# Patient Record
Sex: Female | Born: 2000 | Race: Black or African American | Hispanic: No | Marital: Single | Smoking: Never smoker
Health system: Southern US, Community
[De-identification: ages and names within clinical notes are randomized; demographics above are authoritative.]

## PROBLEM LIST (undated history)

## (undated) DIAGNOSIS — J45909 Unspecified asthma, uncomplicated: Secondary | ICD-10-CM

---
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
## 2020-01-26 ENCOUNTER — Other Ambulatory Visit: Payer: Self-pay

## 2020-01-26 ENCOUNTER — Ambulatory Visit (LOCAL_COMMUNITY_HEALTH_CENTER): Payer: Medicaid Other

## 2020-01-26 VITALS — BP 123/77 | Ht 62.0 in | Wt 170.0 lb

## 2020-01-26 DIAGNOSIS — Z3042 Encounter for surveillance of injectable contraceptive: Secondary | ICD-10-CM

## 2020-01-26 DIAGNOSIS — Z3009 Encounter for other general counseling and advice on contraception: Secondary | ICD-10-CM

## 2020-01-26 DIAGNOSIS — Z30013 Encounter for initial prescription of injectable contraceptive: Secondary | ICD-10-CM | POA: Diagnosis not present

## 2020-01-26 MED ORDER — MEDROXYPROGESTERONE ACETATE 150 MG/ML IM SUSP
150.0000 mg | Freq: Once | INTRAMUSCULAR | Status: AC
  Administered 2020-01-26: 150 mg via INTRAMUSCULAR

## 2020-01-26 NOTE — Progress Notes (Signed)
14.1 weeks post depo today.  DMPA 150 mg IM administered today per Larene Pickett, FNP order dated 08/03/2019.

## 2020-02-16 ENCOUNTER — Ambulatory Visit: Payer: Self-pay | Admitting: Advanced Practice Midwife

## 2020-02-16 ENCOUNTER — Encounter: Payer: Self-pay | Admitting: Advanced Practice Midwife

## 2020-02-16 ENCOUNTER — Other Ambulatory Visit: Payer: Self-pay

## 2020-02-16 DIAGNOSIS — Z5321 Procedure and treatment not carried out due to patient leaving prior to being seen by health care provider: Secondary | ICD-10-CM

## 2020-02-16 NOTE — Progress Notes (Signed)
Here today for STD screening. Declines bloodwork. Kytzia Gienger, RN ? ?

## 2020-02-16 NOTE — Progress Notes (Addendum)
Clinic was running behind. Explained this to patient when brought back to exam room. Patient verbalized understanding but then later changed mind. Patient left without being seen. Stated she had to reschedule appt due to time restraint. Tawny Hopping, RN

## 2020-04-26 ENCOUNTER — Other Ambulatory Visit: Payer: Self-pay

## 2020-04-26 ENCOUNTER — Ambulatory Visit (LOCAL_COMMUNITY_HEALTH_CENTER): Payer: Medicaid Other

## 2020-04-26 VITALS — BP 121/70 | Ht 62.0 in | Wt 166.5 lb

## 2020-04-26 DIAGNOSIS — Z30013 Encounter for initial prescription of injectable contraceptive: Secondary | ICD-10-CM

## 2020-04-26 DIAGNOSIS — Z3009 Encounter for other general counseling and advice on contraception: Secondary | ICD-10-CM | POA: Diagnosis not present

## 2020-04-26 MED ORDER — MEDROXYPROGESTERONE ACETATE 150 MG/ML IM SUSP
150.0000 mg | Freq: Once | INTRAMUSCULAR | Status: AC
  Administered 2020-04-26: 150 mg via INTRAMUSCULAR

## 2020-04-26 NOTE — Progress Notes (Signed)
Folic acid counseling completed and MVI declined. Depo administered without difficulty per Dr. Alvester Morin standing order and client tolerated without complaint. Client counseled that physical is overdue and needed prior to Depo due date of 07/12/2020. Client states will schedule appt so can have physical / Depo same day. Ruth Ng, RN

## 2020-08-23 ENCOUNTER — Ambulatory Visit
Admission: EM | Admit: 2020-08-23 | Discharge: 2020-08-23 | Disposition: A | Discharge status: Home / Self Care | Payer: Medicaid Other | Attending: Family Medicine | Admitting: Family Medicine

## 2020-08-23 ENCOUNTER — Other Ambulatory Visit: Payer: Self-pay

## 2020-08-23 DIAGNOSIS — M546 Pain in thoracic spine: Secondary | ICD-10-CM

## 2020-08-23 DIAGNOSIS — T148XXA Other injury of unspecified body region, initial encounter: Secondary | ICD-10-CM

## 2020-08-23 MED ORDER — CYCLOBENZAPRINE HCL 10 MG PO TABS: 10.0000 mg | ORAL_TABLET | Freq: Two times a day (BID) | ORAL | 0 refills | Status: AC | PRN

## 2020-08-23 MED ORDER — IBUPROFEN 800 MG PO TABS: 800.0000 mg | ORAL_TABLET | Freq: Three times a day (TID) | ORAL | 0 refills | Status: AC | PRN

## 2020-08-23 NOTE — Discharge Instructions (Signed)
Take ibuprofen as needed for your pain.    Take the muscle relaxer Flexeril as needed for muscle spasm; Do not drive, operate machinery, or drink alcohol with this medication as it may make you drowsy.    Follow up with your primary care provider or an orthopedist if your pain is not improving.     

## 2020-08-23 NOTE — ED Triage Notes (Signed)
Pt is here with lower back pain from putting up a 50lb pallat today at work, pt has not taken any meds to relieve discomfort. States it hurts more when she takes deep breaths and twist and turns.

## 2020-08-23 NOTE — ED Provider Notes (Signed)
[facility]   [id number] 08/23/20 Arrival Time: 1451  FG:HWEXH PAIN  SUBJECTIVE: History from: patient. Ruth Mills is a 19 y.o. female complains of left-sided thoracic back pain that began at 12:00 today.  Reports that she was lifting a pallet at work, and that when she twisted she felt something pull and has had pain since.  Has not attempted OTC treatment at this time.  Symptoms are alleviated with rest, symptoms are aggravated with activity. Denies similar symptoms in the past. Denies fever, chills, erythema, ecchymosis, effusion, weakness, numbness and tingling, saddle paresthesias, loss of bowel or bladder function.      ROS: As per HPI.  All other pertinent ROS negative.     Past Medical History:  Diagnosis Date  . Asthma    History reviewed. No pertinent surgical history. No Known Allergies No current facility-administered medications on file prior to encounter.   Current Outpatient Medications on File Prior to Encounter  Medication Sig Dispense Refill  . norethindrone (MICRONOR) 0.35 MG tablet Take 1 tablet by mouth daily.     Social History   Socioeconomic History  . Marital status: Single    Spouse name: Not on file  . Number of children: Not on file  . Years of education: Not on file  . Highest education level: Not on file  Occupational History  . Not on file  Tobacco Use  . Smoking status: Never Smoker  . Smokeless tobacco: Never Used  Substance and Sexual Activity  . Alcohol use: Not on file  . Drug use: Not on file  . Sexual activity: Yes    Birth control/protection: Injection  Other Topics Concern  . Not on file  Social History Narrative  . Not on file   Social Determinants of Health   Financial Resource Strain:   . Difficulty of Paying Living Expenses: Not on file  Food Insecurity:   . Worried About Programme researcher, broadcasting/film/video in the Last Year: Not on file  . Ran Out of Food in the Last Year: Not on file  Transportation Needs:   . Lack  of Transportation (Medical): Not on file  . Lack of Transportation (Non-Medical): Not on file  Physical Activity:   . Days of Exercise per Week: Not on file  . Minutes of Exercise per Session: Not on file  Stress:   . Feeling of Stress : Not on file  Social Connections:   . Frequency of Communication with Friends and Family: Not on file  . Frequency of Social Gatherings with Friends and Family: Not on file  . Attends Religious Services: Not on file  . Active Member of Clubs or Organizations: Not on file  . Attends Banker Meetings: Not on file  . Marital Status: Not on file  Intimate Partner Violence:   . Fear of Current or Ex-Partner: Not on file  . Emotionally Abused: Not on file  . Physically Abused: Not on file  . Sexually Abused: Not on file   Family History  Problem Relation Age of Onset  . Hypertension Mother   . Diabetes Mother   . Diabetes Maternal Grandmother   . Diabetes Maternal Grandfather   . Hypertension Half-Sister   . Healthy Father     OBJECTIVE:  Vitals:   08/23/20 1454 08/23/20 1457  BP:  116/73  Pulse:  89  Resp:  19  Temp:  98.8 F (37.1 C)  TempSrc:  Oral  SpO2:  98%  Weight: 177 lb (80.3  kg)     General appearance: ALERT; in no acute distress.  Head: NCAT Lungs: Normal respiratory effort CV: XX pulses 2+ bilaterally. Cap refill < 2 seconds Musculoskeletal:  Inspection: Skin warm, dry, clear and intact without obvious erythema, effusion, or ecchymosis.  Palpation: L back from thoracic to  Mid back tender to palpation ROM: limited ROM active and passive with twisting motions Skin: warm and dry Neurologic: Ambulates without difficulty; Sensation intact about the upper/ lower extremities Psychological: alert and cooperative; normal mood and affect  DIAGNOSTIC STUDIES:  No results found.   ASSESSMENT & PLAN:  1. Acute left-sided thoracic back pain   2. Muscle strain       Meds ordered this encounter  Medications  .  ibuprofen (ADVIL) 800 MG tablet    Sig: Take 1 tablet (800 mg total) by mouth every 8 (eight) hours as needed for moderate pain.    Dispense:  21 tablet    Refill:  0    Order Specific Question:   Supervising Provider    Answer:   Merrilee Jansky [id number]  . cyclobenzaprine (FLEXERIL) 10 MG tablet    Sig: Take 1 tablet (10 mg total) by mouth 2 (two) times daily as needed for muscle spasms.    Dispense:  20 tablet    Refill:  0    Order Specific Question:   Supervising Provider    Answer:   Merrilee Jansky [id number]    Continue conservative management of rest, ice, and gentle stretches Take ibuprofen as needed for pain relief (may cause abdominal discomfort, ulcers, and GI bleeds avoid taking with other NSAIDs) Take cyclobenzaprine at nighttime for symptomatic relief. Avoid driving or operating heavy machinery while using medication. Follow up with PCP if symptoms persist Return or go to the ER if you have any new or worsening symptoms (fever, chills, chest pain, abdominal pain, changes in bowel or bladder habits, pain radiating into lower legs)  Work note provided  Reviewed expectations re: course of current medical issues. Questions answered. Outlined signs and symptoms indicating need for more acute intervention. Patient verbalized understanding. After Visit Summary given.       Moshe Cipro, NP 08/23/20 1533

## 2020-12-13 ENCOUNTER — Ambulatory Visit
Admission: EM | Admit: 2020-12-13 | Discharge: 2020-12-13 | Disposition: A | Discharge status: Home / Self Care | Payer: Medicaid Other

## 2020-12-13 DIAGNOSIS — T148XXA Other injury of unspecified body region, initial encounter: Secondary | ICD-10-CM

## 2020-12-13 NOTE — ED Triage Notes (Signed)
Pt reports having puncture wound to R ring finger. sts she stuck a nail in her finger today at work.

## 2020-12-13 NOTE — Discharge Instructions (Addendum)
Keep the area clean and dry Antibiotic ointment and keep wrapped. Watch for signs of infection to include severe redness, draining, swelling or pain at the site.

## 2020-12-13 NOTE — ED Provider Notes (Signed)
Renaldo Fiddler    CSN: [id number] Arrival date & time: 12/13/20  1137      History   Chief Complaint Chief Complaint  Patient presents with  . Puncture Wound    HPI Ruth Mills is a 19 y.o. female.   Patient is a 19 year old female who presents today with puncture wound to the right ring finger.  This occurred today at work.  Reporting the very tip went into the finger.  She has since cleaned the area and placed bandage.  She is up-to-date on her tetanus shot.  Mild tenderness to the area numbness     Past Medical History:  Diagnosis Date  . Asthma     Patient Active Problem List   Diagnosis Date Noted  . Finger deformity, left 08/10/2015    History reviewed. No pertinent surgical history.  OB History   No obstetric history on file.      Home Medications    Prior to Admission medications   Medication Sig Start Date End Date Taking? Authorizing Provider  cyclobenzaprine (FLEXERIL) 10 MG tablet Take 1 tablet (10 mg total) by mouth 2 (two) times daily as needed for muscle spasms. 08/23/20   Moshe Cipro, NP  ibuprofen (ADVIL) 800 MG tablet Take 1 tablet (800 mg total) by mouth every 8 (eight) hours as needed for moderate pain. 08/23/20   Moshe Cipro, NP  norethindrone (MICRONOR) 0.35 MG tablet Take 1 tablet by mouth daily. 04/05/19   Matt Holmes, PA    Family History Family History  Problem Relation Age of Onset  . Hypertension Mother   . Diabetes Mother   . Diabetes Maternal Grandmother   . Diabetes Maternal Grandfather   . Hypertension Half-Sister   . Healthy Father     Social History Social History   Tobacco Use  . Smoking status: Never Smoker  . Smokeless tobacco: Never Used     Allergies   Patient has no known allergies.   Review of Systems Review of Systems   Physical Exam Triage Vital Signs ED Triage Vitals  Enc Vitals Group     BP 12/13/20 1442 135/88     Pulse Rate 12/13/20 1442 96     Resp  12/13/20 1442 16     Temp 12/13/20 1442 98.3 F (36.8 C)     Temp Source 12/13/20 1442 Oral     SpO2 12/13/20 1442 97 %     Weight --      Height --      Head Circumference --      Peak Flow --      Pain Score 12/13/20 1443 5     Pain Loc --      Pain Edu? --      Excl. in GC? --    No data found.  Updated Vital Signs BP 135/88   Pulse 96   Temp 98.3 F (36.8 C) (Oral)   Resp 16   SpO2 97%   Visual Acuity Right Eye Distance:   Left Eye Distance:   Bilateral Distance:    Right Eye Near:   Left Eye Near:    Bilateral Near:     Physical Exam Vitals and nursing note reviewed.  Constitutional:      General: She is not in acute distress.    Appearance: Normal appearance. She is not ill-appearing, toxic-appearing or diaphoretic.  HENT:     Head: Normocephalic.  Eyes:     Conjunctiva/sclera: Conjunctivae normal.  Pulmonary:  Effort: Pulmonary effort is normal.  Musculoskeletal:        General: Normal range of motion.     Cervical back: Normal range of motion.  Skin:    General: Skin is warm and dry.     Findings: No rash.     Comments: Very small puncture wound to the tip of the right ring finger.  Bleeding controlled.  Neurological:     Mental Status: She is alert.  Psychiatric:        Mood and Affect: Mood normal.      UC Treatments / Results  Labs (all labs ordered are listed, but only abnormal results are displayed) Labs Reviewed - No data to display  EKG   Radiology No results found.  Procedures Procedures (including critical care time)  Medications Ordered in UC Medications - No data to display  Initial Impression / Assessment and Plan / UC Course  I have reviewed the triage vital signs and the nursing notes.  Pertinent labs & imaging results that were available during my care of the patient were reviewed by me and considered in my medical decision making (see chart for details).     Puncture wound Self-limiting. Keep area clean  and use antibiotic ointment to prevent infection.  Keep dressed with Band-Aid. No need to reupdate tetanus today. Watch for signs of infection Final Clinical Impressions(s) / UC Diagnoses   Final diagnoses:  Puncture wound     Discharge Instructions     Keep the area clean and dry Antibiotic ointment and keep wrapped. Watch for signs of infection to include severe redness, draining, swelling or pain at the site.    ED Prescriptions    None     PDMP not reviewed this encounter.   Janace Aris, NP 12/13/20 1453

## 2021-12-28 ENCOUNTER — Encounter: Payer: Self-pay | Admitting: Emergency Medicine

## 2021-12-28 ENCOUNTER — Emergency Department: Payer: BLUE CROSS/BLUE SHIELD

## 2021-12-28 ENCOUNTER — Other Ambulatory Visit: Payer: Self-pay

## 2021-12-28 ENCOUNTER — Emergency Department
Admission: EM | Admit: 2021-12-28 | Discharge: 2021-12-28 | Disposition: A | Discharge status: Home / Self Care | Payer: BLUE CROSS/BLUE SHIELD | Attending: Emergency Medicine | Admitting: Emergency Medicine

## 2021-12-28 DIAGNOSIS — R519 Headache, unspecified: Secondary | ICD-10-CM | POA: Diagnosis present

## 2021-12-28 DIAGNOSIS — G43001 Migraine without aura, not intractable, with status migrainosus: Secondary | ICD-10-CM | POA: Diagnosis not present

## 2021-12-28 DIAGNOSIS — J45909 Unspecified asthma, uncomplicated: Secondary | ICD-10-CM | POA: Diagnosis not present

## 2021-12-28 NOTE — ED Provider Notes (Signed)
[facility] Emergency Department Provider Note   ____________________________________________   Event Date/Time   First MD Initiated Contact with Patient 12/28/21 1241     (approximate)  I have reviewed the triage vital signs and the nursing notes.   HISTORY  Chief Complaint Migraine    HPI Ruth Mills is a 21 y.o. female patient states 2 years of migraine headaches.  Patient was taking 1000 mg of Tylenol she saw her family doctor on 12/25/2021.  Patient was given a prescription for Fiorcet to take 3 times daily as needed headache and also prescription for amitriptyline 25 mg to take at night.  Patient states headache he normally starts in the late morning at the back of her neck and days.  Anteriorly.  Denies vision change, vertigo, weakness.  No nausea associated with complaint.  States mild photophobia requiring her to close her eyes to relieve sensation.  Patient seen by internal medicine at the [facility] clinic today and was advised to go to the ED. Rates headache as a 10/10.      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 No pertinent surgical history.  Prior to Admission medications   Medication Sig Start Date End Date Taking? Authorizing Provider  cyclobenzaprine (FLEXERIL) 10 MG tablet Take 1 tablet (10 mg total) by mouth 2 (two) times daily as needed for muscle spasms. Patient not taking: Reported on 12/28/2021 08/23/20   Moshe Cipro, NP  ibuprofen (ADVIL) 800 MG tablet Take 1 tablet (800 mg total) by mouth every 8 (eight) hours as needed for moderate pain. 08/23/20   Moshe Cipro, NP  norethindrone (MICRONOR) 0.35 MG tablet Take 1 tablet by mouth daily. 04/05/19   Matt Holmes, PA    Allergies Patient has no known allergies.  Family History  Problem Relation Age of Onset   Hypertension Mother     Diabetes Mother    Diabetes Maternal Grandmother    Diabetes Maternal Grandfather    Hypertension Half-Sister    Healthy Father     Social History Social History   Tobacco Use   Smoking status: Never   Smokeless tobacco: Never    Review of Systems  Constitutional: No fever/chills Eyes: No visual changes. ENT: No sore throat. Cardiovascular: Denies chest pain. Respiratory: Denies shortness of breath. Gastrointestinal: No abdominal pain.  No nausea, no vomiting.  No diarrhea.  No constipation. Genitourinary: Negative for dysuria. Musculoskeletal: Negative for back pain. Skin: Negative for rash. Neurological: Positive for headaches, but denies focal weakness or numbness.  ____________________________________________   PHYSICAL EXAM:  VITAL SIGNS: ED Triage Vitals  Enc Vitals Group     BP 12/28/21 1209 (!) 132/96     Pulse Rate 12/28/21 1209 97     Resp 12/28/21 1209 16     Temp 12/28/21 1209 98.4 F (36.9 C)     Temp Source 12/28/21 1209 Oral     SpO2 12/28/21 1209 100 %     Weight 12/28/21 1210 193 lb (87.5 kg)     Height 12/28/21 1210 5\' 2"  (1.575 m)     Head Circumference --      Peak Flow --      Pain Score 12/28/21 1209 10     Pain Loc --      Pain Edu? --      Excl. in GC? --  Constitutional: Alert and oriented. Well appearing and in no acute distress. Eyes: Conjunctivae are normal. PERRL. EOMI. Head: Atraumatic. Nose: No congestion/rhinnorhea. Mouth/Throat: Mucous membranes are moist.  Oropharynx non-erythematous. Neck: No stridor.  No cervical spine tenderness to palpation. Hematological/Lymphatic/Immunilogical: No cervical lymphadenopathy. Cardiovascular: Normal rate, regular rhythm. Grossly normal heart sounds.  Good peripheral circulation. Respiratory: Normal respiratory effort.  No retractions. Lungs CTAB. Gastrointestinal: Soft and nontender. No distention. No abdominal bruits. No CVA tenderness. Genitourinary: Deferred Musculoskeletal: No  lower extremity tenderness nor edema.  No joint effusions. Neurologic:  Normal speech and language. No gross focal neurologic deficits are appreciated. No gait instability. Skin:  Skin is warm, dry and intact. No rash noted. Psychiatric: Mood and affect are normal. Speech and behavior are normal.  ____________________________________________   LABS (all labs ordered are listed, but only abnormal results are displayed)  Labs Reviewed - No data to display ____________________________________________  EKG   ____________________________________________  RADIOLOGY I, Joni Reining, personally viewed and evaluated these images (plain radiographs) as part of my medical decision making, as well as reviewing the written report by the radiologist.  ED MD interpretation:    Official radiology report(s): CT HEAD WO CONTRAST ( )  Result Date: 12/28/2021 CLINICAL DATA:  Headache. EXAM: CT HEAD WITHOUT CONTRAST TECHNIQUE: Contiguous axial images were obtained from the base of the skull through the vertex without intravenous contrast. RADIATION DOSE REDUCTION: This exam was performed according to the departmental dose-optimization program which includes automated exposure control, adjustment of the mA and/or kV according to patient size and/or use of iterative reconstruction technique. COMPARISON:  February 03, 2018. FINDINGS: Brain: No evidence of acute infarction, hemorrhage, hydrocephalus, extra-axial collection or mass lesion/mass effect. Vascular: No hyperdense vessel or unexpected calcification. Skull: Normal. Negative for fracture or focal lesion. Sinuses/Orbits: No acute finding. Other: None. IMPRESSION: No acute intracranial abnormality seen. Electronically Signed   By: Lupita Raider M.D.   On: 12/28/2021 14:05    ____________________________________________   PROCEDURES  Procedure(s) performed (including Critical  Care):  Procedures   ____________________________________________   INITIAL IMPRESSION / ASSESSMENT AND PLAN / ED COURSE  As part of my medical decision making, I reviewed the following data within the electronic MEDICAL RECORD NUMBER   Patient presents with 2-year history of headaches which has increased in the past 2 days.  Patient was initially seen today by PCP was sent to the emergency room for further evaluation.  Differentials consist of migraine versus tension headache.  Further evaluation with CT scan is warranted.        Discussed no acute findings of CT scan with patient.  Advised to continue previous medication follow-up with scheduled neurologist.  Return right ED if condition worsens.      ____________________________________________   FINAL CLINICAL IMPRESSION(S) / ED DIAGNOSES  Final diagnoses:  Migraine without aura and with status migrainosus, not intractable     ED Discharge Orders     None        Note:  This document was prepared using Dragon voice recognition software and may include unintentional dictation errors.    Joni Reining, PA-C 12/28/21 1421    Dionne Bucy, MD 12/28/21 1454

## 2021-12-28 NOTE — ED Triage Notes (Signed)
Pt states that she has been having headaches daily since age 21, states that she has been taking 1000mg  of tylenol tid and was recently started on a new medication to prevent her from taking so much tylenol, pt states that so far it isn't helping and states that she is being referred to a neurologist if this med doesn't work, pt states that she has never had a head ct and her dr mentioned to her something about a brain aneurysm and pt is concerned about that

## 2021-12-28 NOTE — Discharge Instructions (Signed)
No acute findings on CT of the head.  Follow-up with scheduled neurology appointment and continue previous medications.

## 2022-01-10 ENCOUNTER — Other Ambulatory Visit: Payer: Self-pay | Admitting: Physician Assistant

## 2022-01-10 DIAGNOSIS — R51 Headache with orthostatic component, not elsewhere classified: Secondary | ICD-10-CM

## 2022-01-19 ENCOUNTER — Ambulatory Visit: Payer: BLUE CROSS/BLUE SHIELD

## 2022-01-29 ENCOUNTER — Other Ambulatory Visit: Payer: Self-pay

## 2022-01-29 ENCOUNTER — Ambulatory Visit
Admission: RE | Admit: 2022-01-29 | Discharge: 2022-01-29 | Disposition: A | Discharge status: Home / Self Care | Payer: BLUE CROSS/BLUE SHIELD | Source: Ambulatory Visit | Attending: Physician Assistant | Admitting: Physician Assistant

## 2022-01-29 DIAGNOSIS — R51 Headache with orthostatic component, not elsewhere classified: Secondary | ICD-10-CM | POA: Diagnosis present

## 2022-01-29 MED ORDER — GADOBUTROL 1 MMOL/ML IV SOLN
7.5000 mL | Freq: Once | INTRAVENOUS | Status: AC | PRN
  Administered 2022-01-29: 7.5 mL via INTRAVENOUS

## 2023-05-24 IMAGING — CT CT HEAD W/O CM
4 series · 17 of 47 positions shown, 19 images · non-contrast
Comparison: February 03, 2018.

CLINICAL DATA: Headache.



[Series 3: head wo · axial · 0.47mm/px · z∈[+375,+480]mm · 7 of 29 slices shown, 9 images]
[im 4/29  brain]
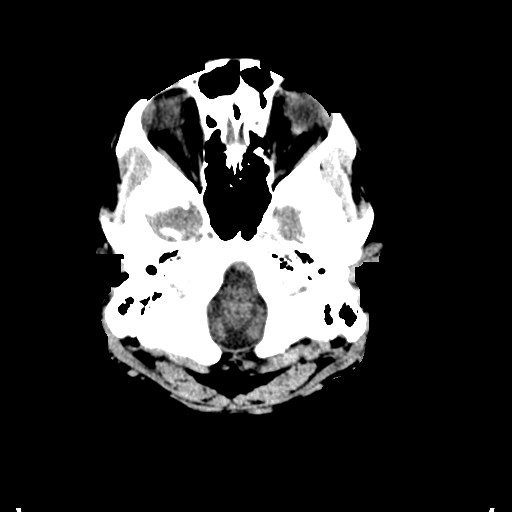
[im 4/29  bone]
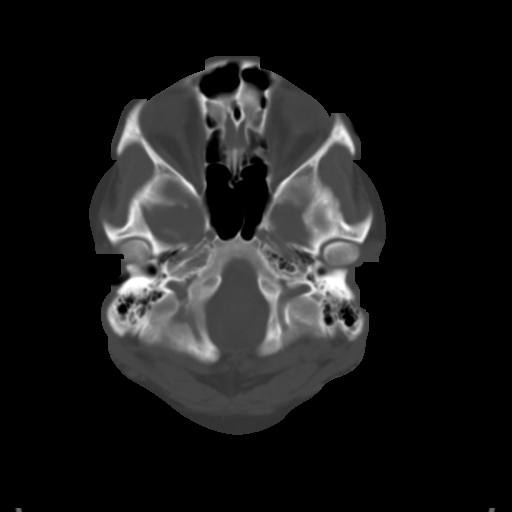
[im 8/29  brain]
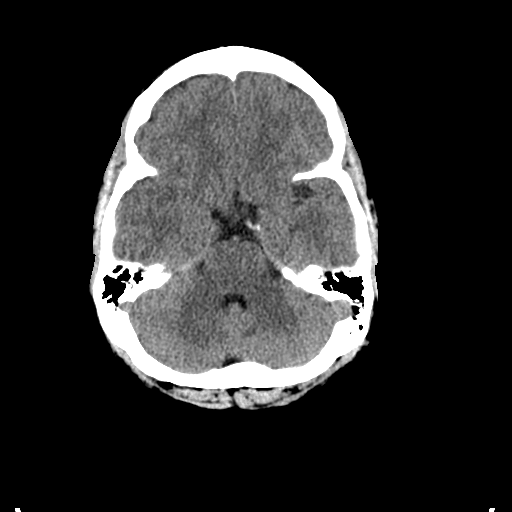
[im 11/29  brain]
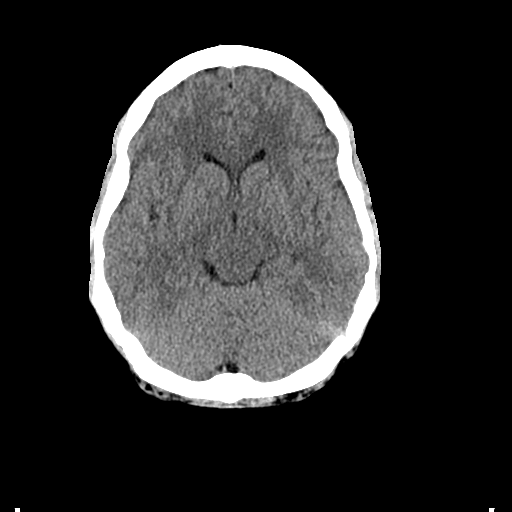
[im 15/29  brain]
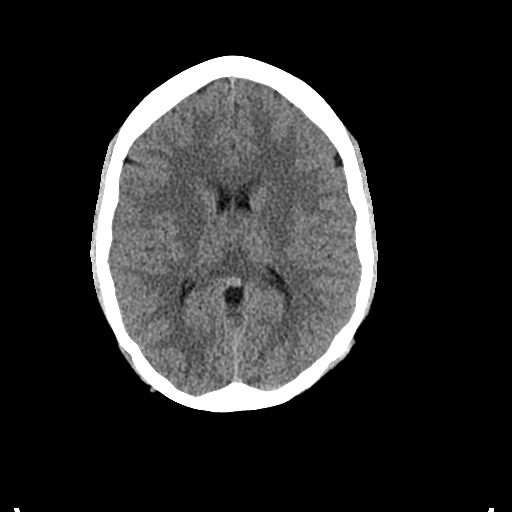
[im 18/29  brain]
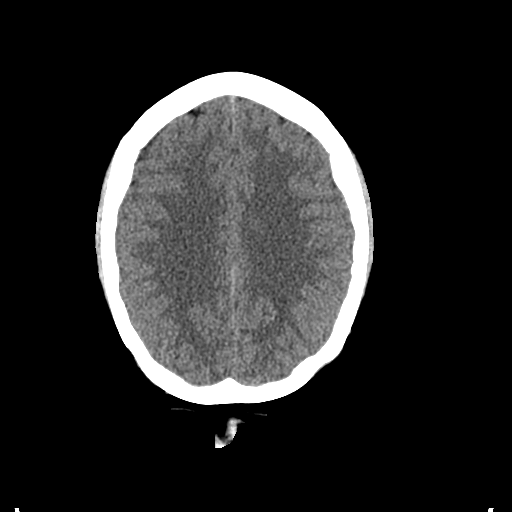
[im 18/29  bone]
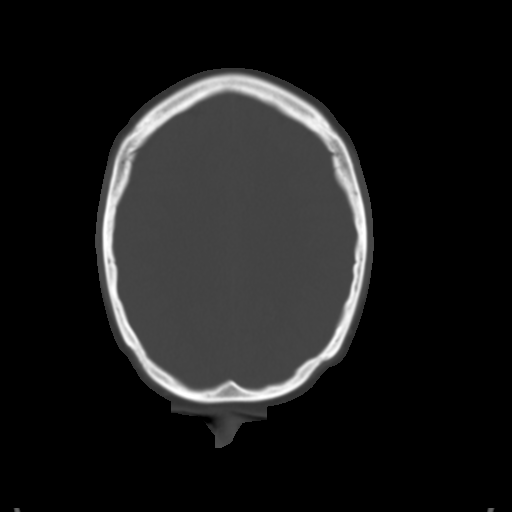
[im 22/29  brain]
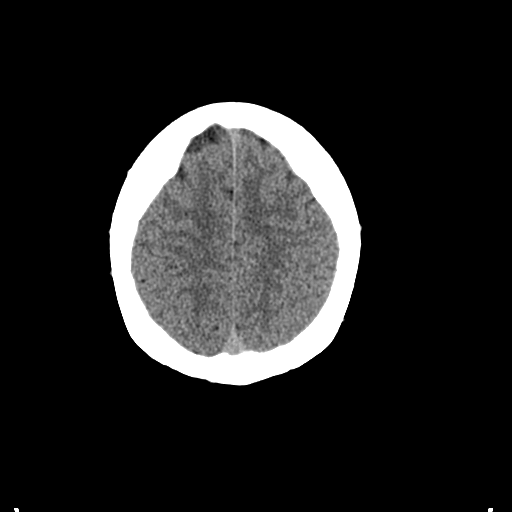
[im 25/29  brain]
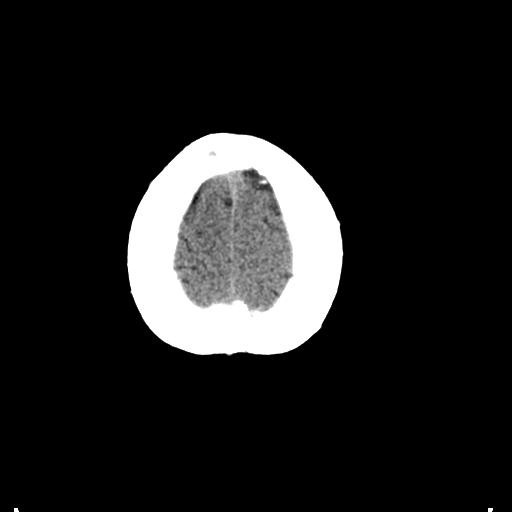

[Series 4: head bone · axial · 0.47mm/px · z∈[+374,+422]mm · 4 of 72 slices shown]
[im 8/72  bone]
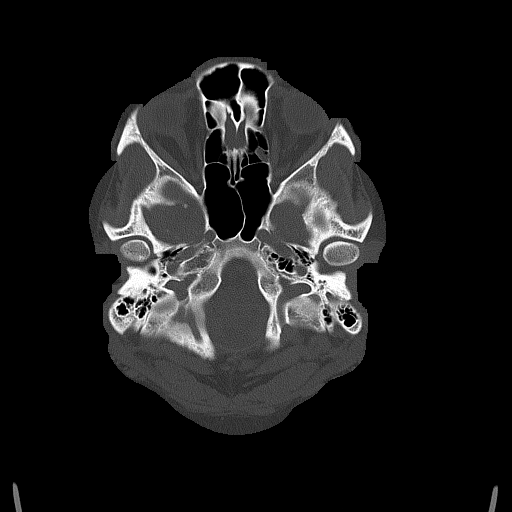
[im 15/72  bone]
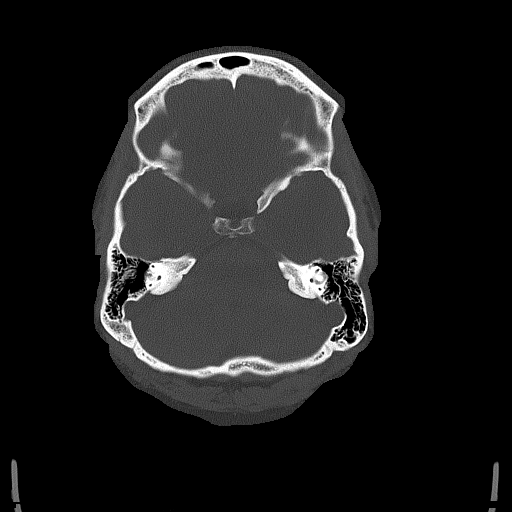
[im 22/72  bone]
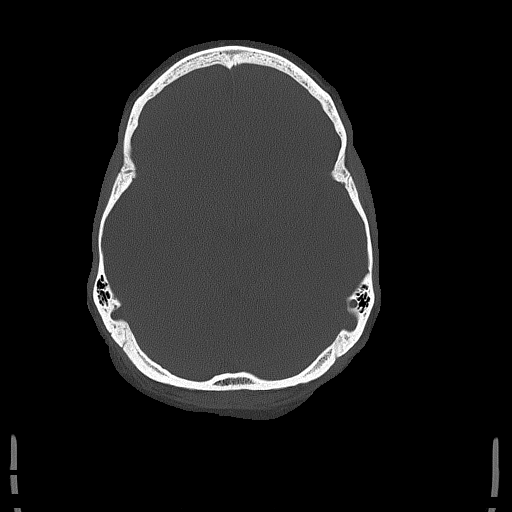
[im 32/72  bone]
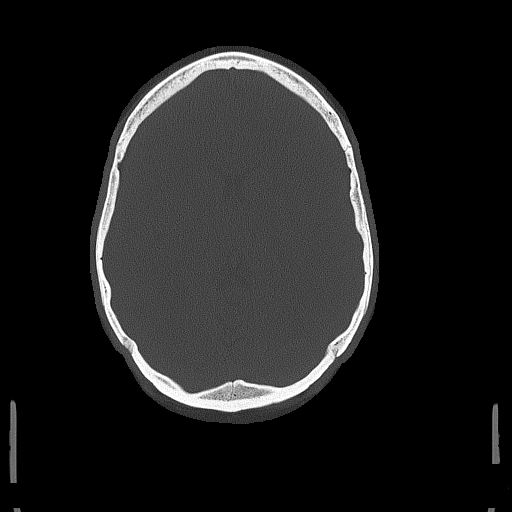

[Series 5: coronal soft tissue · coronal · 0.33mm/px · 3 of 66 slices shown]
[im 22/66  brain]
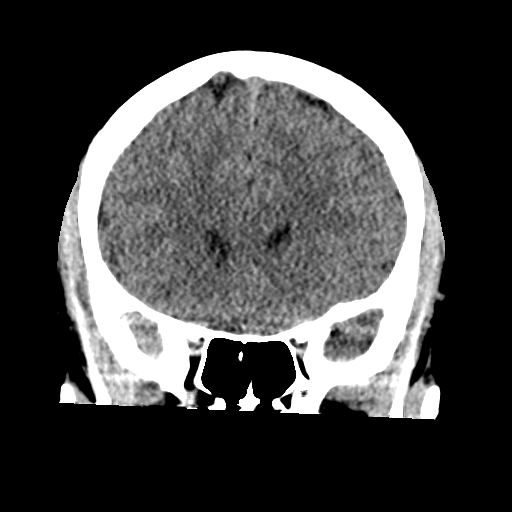
[im 29/66  brain]
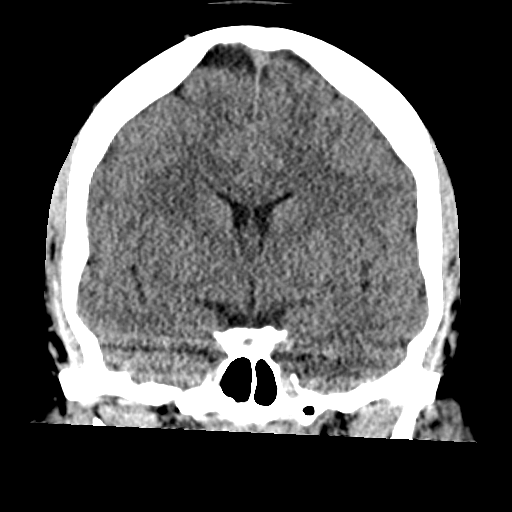
[im 37/66  brain]
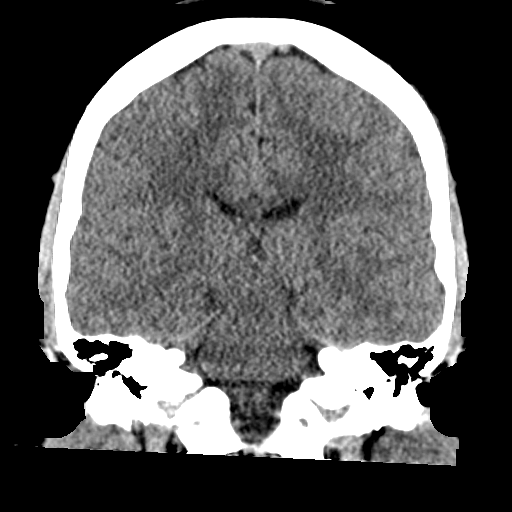

[Series 6: sagittal soft tissue · sagittal · 0.33mm/px · 3 of 56 slices shown]
[im 19/56  brain]
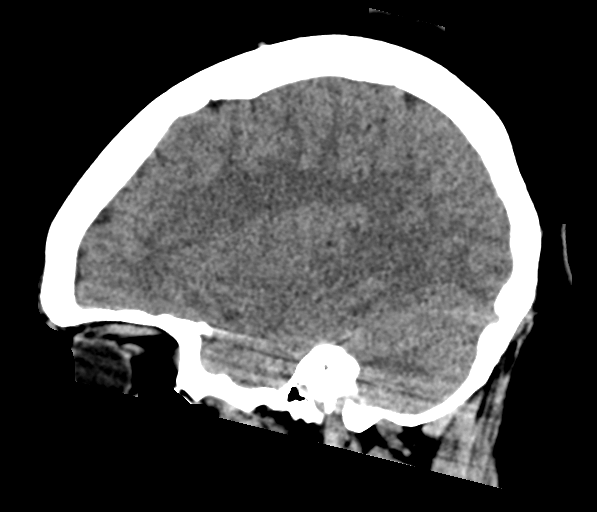
[im 28/56  brain]
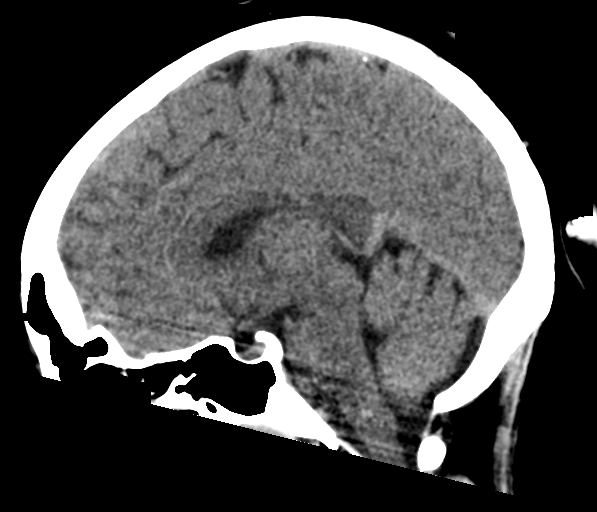
[im 37/56  brain]
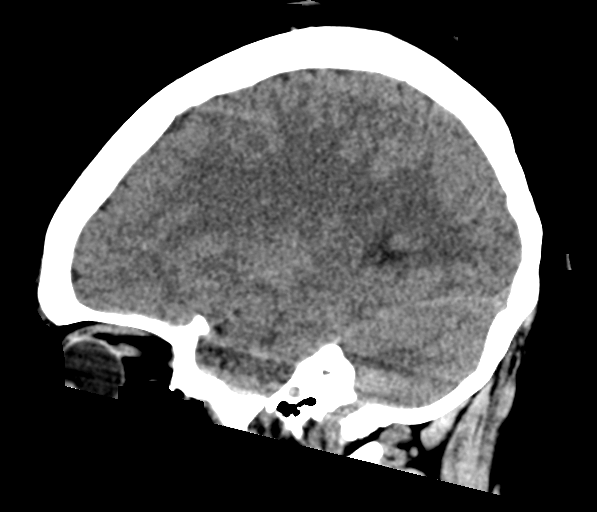

[17 of 47 positions shown; findings below may reference images not displayed]

FINDINGS: Brain: No evidence of acute infarction, hemorrhage, hydrocephalus,
extra-axial collection or mass lesion/mass effect.

Vascular: No hyperdense vessel or unexpected calcification.

Skull: Normal. Negative for fracture or focal lesion.

Sinuses/Orbits: No acute finding.

Other: None.
IMPRESSION: No acute intracranial abnormality seen.

## 2024-11-08 ENCOUNTER — Other Ambulatory Visit: Payer: Self-pay | Admitting: Internal Medicine

## 2024-11-08 DIAGNOSIS — R140 Abdominal distension (gaseous): Secondary | ICD-10-CM

## 2024-11-15 ENCOUNTER — Ambulatory Visit
Admission: RE | Admit: 2024-11-15 | Discharge: 2024-11-15 | Disposition: A | Discharge status: Home / Self Care | Source: Ambulatory Visit | Attending: Internal Medicine | Admitting: Internal Medicine

## 2024-11-15 DIAGNOSIS — R140 Abdominal distension (gaseous): Secondary | ICD-10-CM | POA: Diagnosis present
